# Patient Record
Sex: Female | Born: 1995 | Race: White | Hispanic: No | Marital: Single | Smoking: Never smoker
Health system: Southern US, Community
[De-identification: ages and names within clinical notes are randomized; demographics above are authoritative.]

## PROBLEM LIST (undated history)

## (undated) DIAGNOSIS — J45909 Unspecified asthma, uncomplicated: Secondary | ICD-10-CM

---
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
## 2014-09-22 ENCOUNTER — Emergency Department (HOSPITAL_COMMUNITY)
Admission: EM | Admit: 2014-09-22 | Discharge: 2014-09-23 | Disposition: A | Discharge status: Home / Self Care | Payer: Medicaid Other | Attending: Emergency Medicine | Admitting: Emergency Medicine

## 2014-09-22 ENCOUNTER — Encounter (HOSPITAL_COMMUNITY): Payer: Self-pay | Admitting: Emergency Medicine

## 2014-09-22 ENCOUNTER — Emergency Department (HOSPITAL_COMMUNITY): Payer: Medicaid Other

## 2014-09-22 DIAGNOSIS — R000 Tachycardia, unspecified: Secondary | ICD-10-CM | POA: Diagnosis not present

## 2014-09-22 DIAGNOSIS — J45901 Unspecified asthma with (acute) exacerbation: Secondary | ICD-10-CM

## 2014-09-22 DIAGNOSIS — R0602 Shortness of breath: Secondary | ICD-10-CM | POA: Diagnosis present

## 2014-09-22 HISTORY — DX: Unspecified asthma, uncomplicated: J45.909

## 2014-09-22 MED ORDER — PREDNISONE 20 MG PO TABS: 40.0000 mg | ORAL_TABLET | Freq: Every day | ORAL | Status: AC

## 2014-09-22 MED ORDER — PREDNISONE 20 MG PO TABS
60.0000 mg | ORAL_TABLET | Freq: Once | ORAL | Status: AC
  Administered 2014-09-22: 60 mg via ORAL
  Filled 2014-09-22: qty 3

## 2014-09-22 MED ORDER — IPRATROPIUM-ALBUTEROL 0.5-2.5 (3) MG/3ML IN SOLN
3.0000 mL | Freq: Once | RESPIRATORY_TRACT | Status: AC
  Administered 2014-09-22: 3 mL via RESPIRATORY_TRACT
  Filled 2014-09-22: qty 3

## 2014-09-22 MED ORDER — ALBUTEROL SULFATE (2.5 MG/3ML) 0.083% IN NEBU
5.0000 mg | INHALATION_SOLUTION | Freq: Once | RESPIRATORY_TRACT | Status: AC
  Administered 2014-09-22: 5 mg via RESPIRATORY_TRACT
  Filled 2014-09-22: qty 6

## 2014-09-22 MED ORDER — DEXTROMETHORPHAN POLISTIREX 30 MG/5ML PO LQCR: 30.0000 mg | ORAL | Status: AC | PRN

## 2014-09-22 NOTE — ED Notes (Signed)
Patient here with complaint of cough, wheezing, and shortness of breath. States cough started about 2 days ago and wheezing presented yesterday. Hx asthma. Used rescue inhaler today around 1400 with some relief of symptoms. Cough non-productive. Denies fevers.

## 2014-09-22 NOTE — Discharge Instructions (Signed)
Take prednisone as directed until gone. Take delsym as needed for cough. Refer to attached documents for more information.

## 2014-09-22 NOTE — ED Provider Notes (Signed)
CSN: [id number]     Arrival date & time 09/22/14  2042 History   First MD Initiated Contact with Patient 09/22/14 2215     Chief Complaint  Patient presents with  . Cough  . Wheezing     (Consider location/radiation/quality/duration/timing/severity/associated sxs/prior Treatment) Patient is a 19 y.o. female presenting with cough. The history is provided by the patient. No language interpreter was used.  Cough Cough characteristics:  Hacking Severity:  Moderate Onset quality:  Gradual Duration:  2 days Timing:  Constant Progression:  Unchanged Chronicity:  New Smoker: no   Context: not animal exposure, not exposure to allergens, not fumes, not occupational exposure, not sick contacts, not smoke exposure, not upper respiratory infection, not weather changes and not with activity   Relieved by:  Nothing Worsened by:  Nothing tried Ineffective treatments:  Beta-agonist inhaler Associated symptoms: wheezing   Associated symptoms: no chest pain, no chills, no fever and no shortness of breath   Wheezing:    Severity:  Moderate   Onset quality:  Gradual   Duration:  2 days   Timing:  Intermittent   Progression:  Unchanged   Chronicity:  Recurrent Risk factors: no chemical exposure, no recent infection and no recent travel     Past Medical History  Diagnosis Date  . Asthma    History reviewed. No pertinent past surgical history. History reviewed. No pertinent family history. History  Substance Use Topics  . Smoking status: Never Smoker   . Smokeless tobacco: Not on file  . Alcohol Use: No   OB History    No data available     Review of Systems  Constitutional: Negative for fever, chills and fatigue.  HENT: Negative for trouble swallowing.   Eyes: Negative for visual disturbance.  Respiratory: Positive for cough and wheezing. Negative for shortness of breath.   Cardiovascular: Negative for chest pain and palpitations.  Gastrointestinal: Negative for nausea, vomiting,  abdominal pain and diarrhea.  Genitourinary: Negative for dysuria and difficulty urinating.  Musculoskeletal: Negative for arthralgias and neck pain.  Skin: Negative for color change.  Neurological: Negative for dizziness and weakness.  Psychiatric/Behavioral: Negative for dysphoric mood.      Allergies  Review of patient's allergies indicates no known allergies.  Home Medications   Prior to Admission medications   Not on File   BP 147/88 mmHg  Pulse 106  Temp(Src) 98.5 F (36.9 C) (Oral)  Resp 22  SpO2 98%  LMP 09/07/2014 (Exact Date) Physical Exam  Constitutional: She appears well-developed and well-nourished. No distress.  HENT:  Head: Normocephalic and atraumatic.  Eyes: Conjunctivae and EOM are normal.  Neck: Normal range of motion.  Cardiovascular: Regular rhythm.  Exam reveals no gallop and no friction rub.   No murmur heard. tachycardic  Pulmonary/Chest: Effort normal. She has wheezes. She has no rales. She exhibits no tenderness.  Inspiratory wheezes noted in all lung fields.   Abdominal: Soft. She exhibits no distension. There is no tenderness.  Musculoskeletal: Normal range of motion.  Neurological: She is alert. Coordination normal.  Speech is goal-oriented. Moves limbs without ataxia.   Skin: Skin is warm and dry.  Psychiatric: She has a normal mood and affect. Her behavior is normal.  Nursing note and vitals reviewed.   ED Course  Procedures (including critical care time) Labs Review Labs Reviewed - No data to display  Imaging Review Dg Chest 2 View (if Patient Has Fever And/or Copd)  09/22/2014   CLINICAL DATA:  Acute onset of  dry cough, wheezing, congestion and chest tightness. Initial encounter.  EXAM: CHEST  2 VIEW  COMPARISON:  None.  FINDINGS: The lungs are well-aerated and clear. There is no evidence of focal opacification, pleural effusion or pneumothorax.  The heart is normal in size; the mediastinal contour is within normal limits. No acute  osseous abnormalities are seen.  IMPRESSION: No acute cardiopulmonary process seen.   Electronically Signed   By: Roanna Raider M.D.   On: 09/22/2014 21:31     EKG Interpretation None      MDM   Final diagnoses:  Asthma exacerbation    10:54 PM Chest xray unremarkable for acute changes. Patient mildly tachycardic likely due to wheezing. Patient will have an additional albuterol nebulizer treatment and prednisone. Patient will be discharged with prednisone taper and delsym for cough.   Emilia Beck, PA-C 09/22/14 2350  Doug Sou, MD 09/23/14 [phone]

## 2016-03-29 IMAGING — DX DG CHEST 2V
2 series · 2 of 2 positions shown · non-contrast
Comparison: None.

CLINICAL DATA: Acute onset of dry cough, wheezing, congestion and
chest tightness. Initial encounter.

EXAM:
CHEST  2 VIEW

[chest pa]
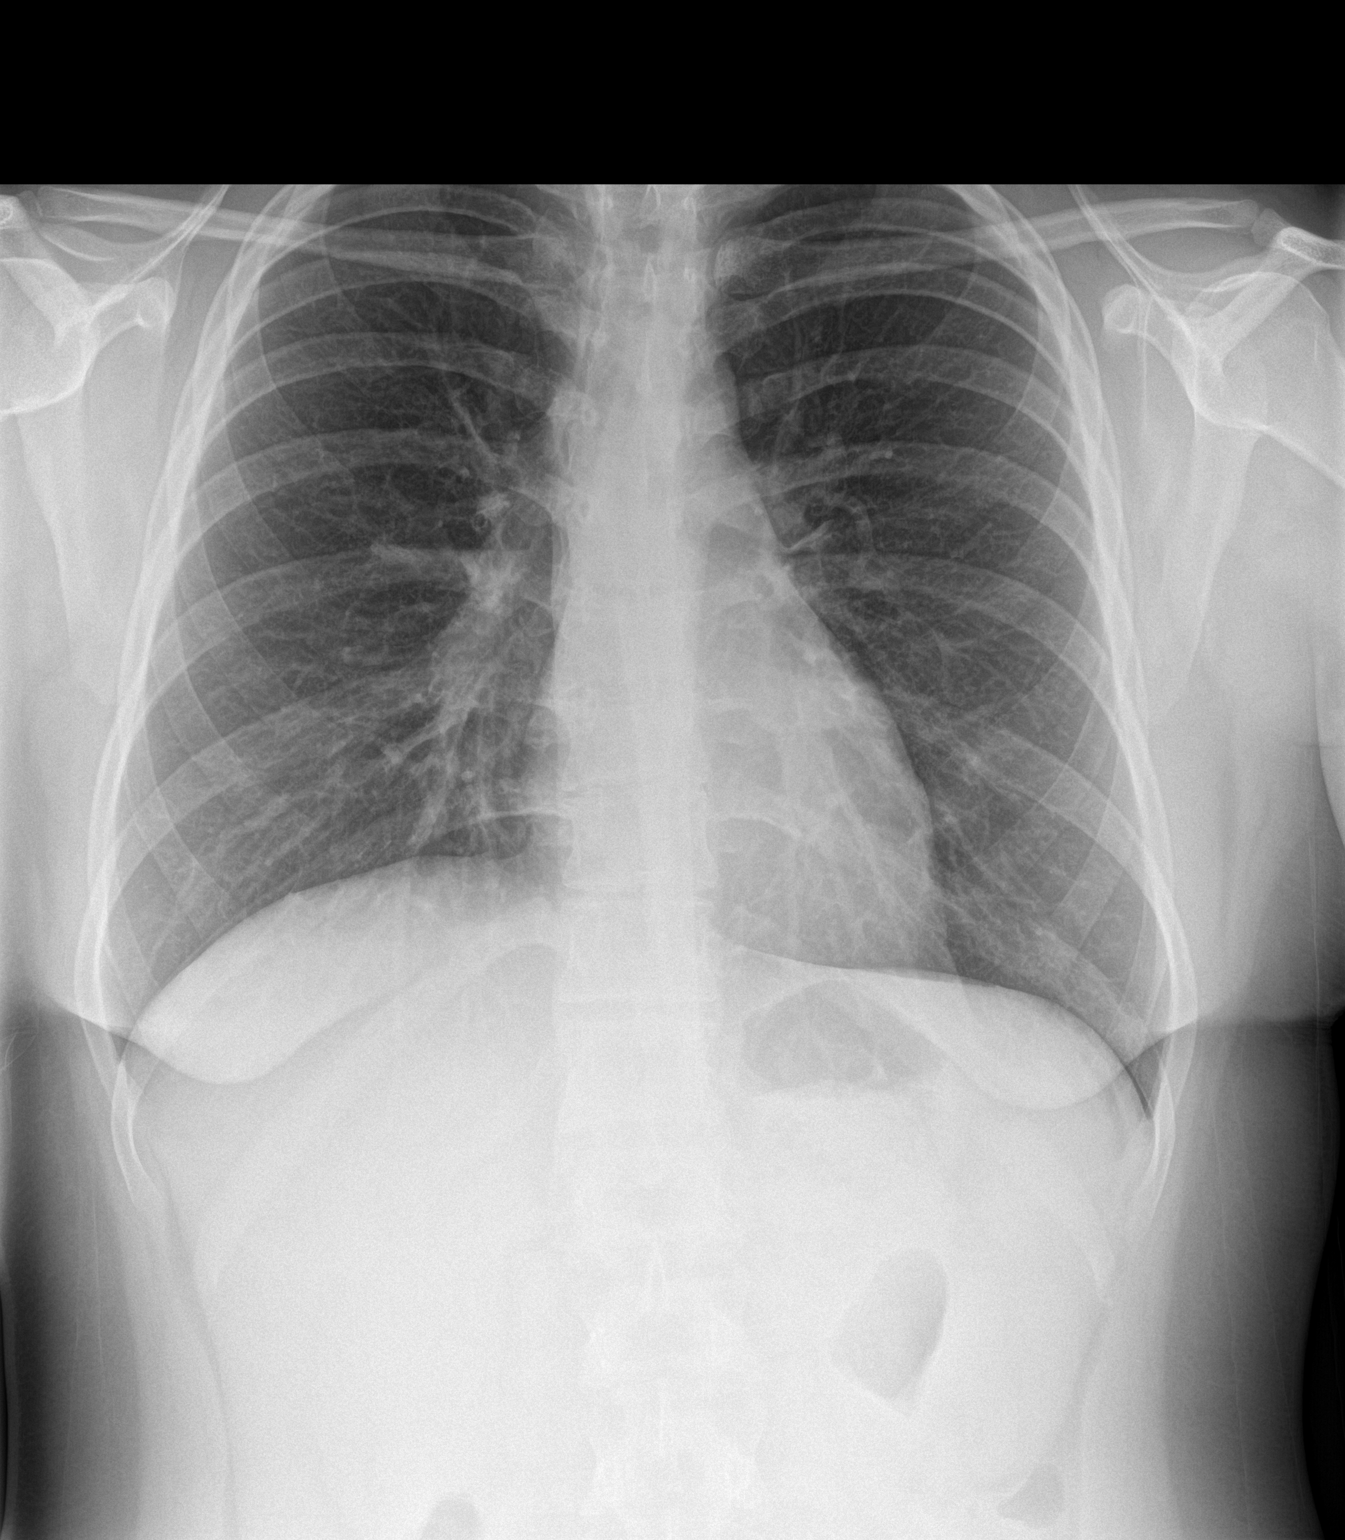

[chest lat]
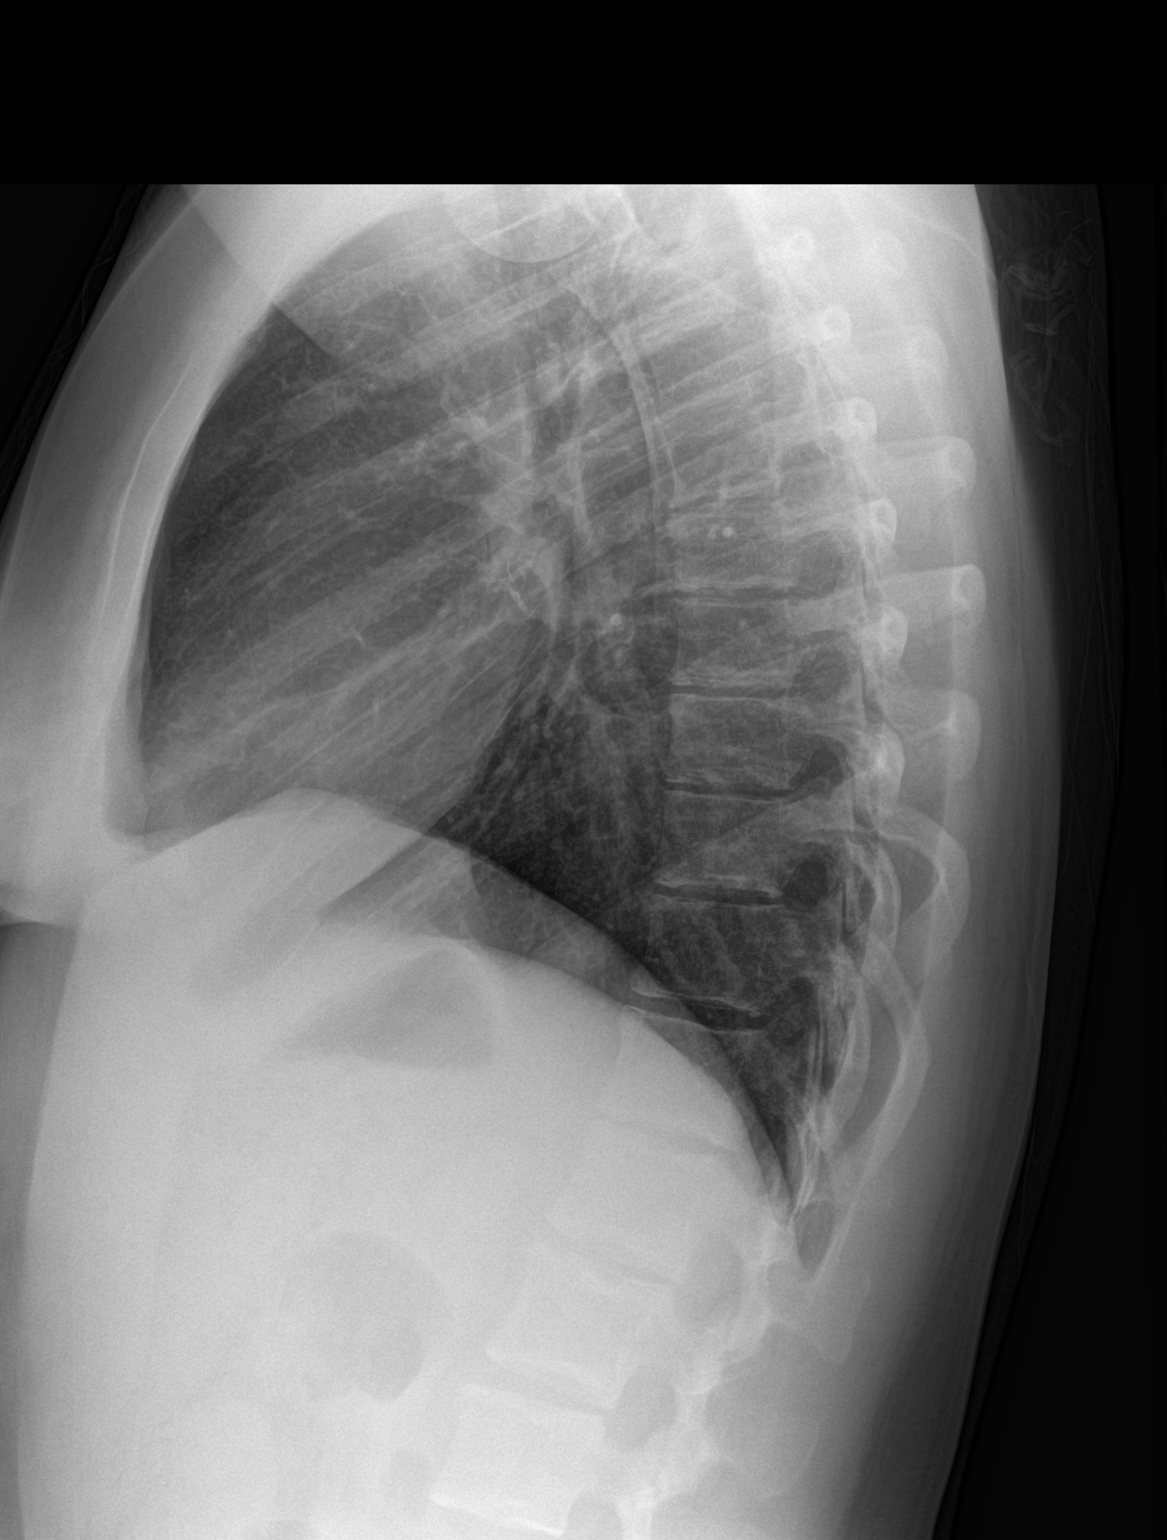

[2 of 2 positions shown; findings below may reference images not displayed]

FINDINGS: The lungs are well-aerated and clear. There is no evidence of focal
opacification, pleural effusion or pneumothorax.

The heart is normal in size; the mediastinal contour is within
normal limits. No acute osseous abnormalities are seen.
IMPRESSION: No acute cardiopulmonary process seen.
